# Patient Record
Sex: Male | Born: 1980 | Race: White | Hispanic: No | Marital: Married | Smoking: Current every day smoker
Health system: Southern US, Community
[De-identification: ages and names within clinical notes are randomized; demographics above are authoritative.]

## PROBLEM LIST (undated history)

## (undated) HISTORY — PX: HERNIA REPAIR: SHX51

---
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
## 2002-06-27 ENCOUNTER — Emergency Department (HOSPITAL_COMMUNITY): Admission: EM | Admit: 2002-06-27 | Discharge: 2002-06-27 | Payer: Self-pay | Admitting: *Deleted

## 2002-11-07 ENCOUNTER — Encounter: Payer: Self-pay | Admitting: *Deleted

## 2002-11-07 ENCOUNTER — Ambulatory Visit (HOSPITAL_COMMUNITY): Admission: RE | Admit: 2002-11-07 | Discharge: 2002-11-07 | Payer: Self-pay | Admitting: *Deleted

## 2003-06-03 ENCOUNTER — Emergency Department (HOSPITAL_COMMUNITY): Admission: EM | Admit: 2003-06-03 | Discharge: 2003-06-03 | Payer: Self-pay | Admitting: Emergency Medicine

## 2011-09-24 ENCOUNTER — Emergency Department: Payer: Self-pay | Admitting: Emergency Medicine

## 2013-07-23 ENCOUNTER — Emergency Department (HOSPITAL_COMMUNITY): Payer: Self-pay

## 2013-07-23 ENCOUNTER — Emergency Department (HOSPITAL_COMMUNITY)
Admission: EM | Admit: 2013-07-23 | Discharge: 2013-07-24 | Disposition: A | Discharge status: Home / Self Care | Payer: Self-pay | Attending: Emergency Medicine | Admitting: Emergency Medicine

## 2013-07-23 ENCOUNTER — Encounter (HOSPITAL_COMMUNITY): Payer: Self-pay | Admitting: Emergency Medicine

## 2013-07-23 DIAGNOSIS — S60011A Contusion of right thumb without damage to nail, initial encounter: Secondary | ICD-10-CM

## 2013-07-23 DIAGNOSIS — IMO0002 Reserved for concepts with insufficient information to code with codable children: Secondary | ICD-10-CM | POA: Insufficient documentation

## 2013-07-23 DIAGNOSIS — S46911A Strain of unspecified muscle, fascia and tendon at shoulder and upper arm level, right arm, initial encounter: Secondary | ICD-10-CM

## 2013-07-23 DIAGNOSIS — F17200 Nicotine dependence, unspecified, uncomplicated: Secondary | ICD-10-CM | POA: Insufficient documentation

## 2013-07-23 DIAGNOSIS — Y929 Unspecified place or not applicable: Secondary | ICD-10-CM | POA: Insufficient documentation

## 2013-07-23 DIAGNOSIS — Y9389 Activity, other specified: Secondary | ICD-10-CM | POA: Insufficient documentation

## 2013-07-23 DIAGNOSIS — S6000XA Contusion of unspecified finger without damage to nail, initial encounter: Secondary | ICD-10-CM | POA: Insufficient documentation

## 2013-07-23 MED ORDER — KETOROLAC TROMETHAMINE 10 MG PO TABS
10.0000 mg | ORAL_TABLET | Freq: Once | ORAL | Status: AC
  Administered 2013-07-23: 10 mg via ORAL
  Filled 2013-07-23: qty 1

## 2013-07-23 MED ORDER — TRAMADOL HCL 50 MG PO TABS
50.0000 mg | ORAL_TABLET | Freq: Once | ORAL | Status: AC
  Administered 2013-07-23: 50 mg via ORAL
  Filled 2013-07-23: qty 1

## 2013-07-23 MED ORDER — MELOXICAM 7.5 MG PO TABS: ORAL_TABLET | ORAL | Status: AC

## 2013-07-23 MED ORDER — DEXAMETHASONE 4 MG PO TABS: ORAL_TABLET | ORAL | Status: AC

## 2013-07-23 MED ORDER — ONDANSETRON HCL 4 MG PO TABS
4.0000 mg | ORAL_TABLET | Freq: Once | ORAL | Status: AC
  Administered 2013-07-23: 4 mg via ORAL
  Filled 2013-07-23: qty 1

## 2013-07-23 MED ORDER — PREDNISONE 50 MG PO TABS
60.0000 mg | ORAL_TABLET | Freq: Once | ORAL | Status: AC
  Administered 2013-07-23: 60 mg via ORAL
  Filled 2013-07-23 (×2): qty 1

## 2013-07-23 MED ORDER — TRAMADOL HCL 50 MG PO TABS: 50.0000 mg | ORAL_TABLET | Freq: Four times a day (QID) | ORAL | Status: AC | PRN

## 2013-07-23 NOTE — ED Notes (Signed)
Patient states was holding a part and hit his right thumb while hitting the part on Friday.  Patient c/o increased pain and swelling.  Patient also c/o right shoulder pain x 1 week.  Patient denies injury or trauma on right shoulder.

## 2013-07-23 NOTE — Discharge Instructions (Signed)
Examination is consistent with a strain of your right shoulder, a bruise to your ulnar nerve, and contusion to the right thumb. Your x-rays are negative for fracture or dislocation. Please use the thumb splint, and a shoulder sling for the next 5-7 days. Please use of Mobic and Decadron daily, please take with food. Use Tramadol for pain if needed. This medication may cause drowsiness, please use with caution. Please see the orthopedist listed above with the orthopedist of your choice if pain persists, or condition worsens. Contusion A contusion is a deep bruise. Contusions happen when an injury causes bleeding under the skin. Signs of bruising include pain, puffiness (swelling), and discolored skin. The contusion may turn blue, purple, or yellow. HOME CARE   Put ice on the injured area.  Put ice in a plastic bag.  Place a towel between your skin and the bag.  Leave the ice on for 15-20 minutes, 03-04 times a day.  Only take medicine as told by your doctor.  Rest the injured area.  If possible, raise (elevate) the injured area to lessen puffiness. GET HELP RIGHT AWAY IF:   You have more bruising or puffiness.  You have pain that is getting worse.  Your puffiness or pain is not helped by medicine. MAKE SURE YOU:   Understand these instructions.  Will watch your condition.  Will get help right away if you are not doing well or get worse. Document Released: 08/19/2007 Document Revised: 05/25/2011 Document Reviewed: 01/05/2011 [facility] Patient Information 2014 [place], [place].
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
## 2013-07-24 NOTE — ED Notes (Signed)
Thumb splint and arm sling applied by Joseph ArtAlfred Dillard, NT.

## 2013-07-25 NOTE — ED Provider Notes (Signed)
Medical screening examination/treatment/procedure(s) were performed by non-physician practitioner and as supervising physician I was immediately available for consultation/collaboration.   EKG Interpretation None        Debborah Alonge L Fard Borunda, MD 07/25/13 0049 

## 2013-08-10 ENCOUNTER — Emergency Department: Payer: Self-pay | Admitting: Internal Medicine

## 2013-08-10 LAB — CBC
HCT: 46.7 % (ref 40.0–52.0)
HGB: 16.1 g/dL (ref 13.0–18.0)
MCH: 29.4 pg (ref 26.0–34.0)
MCHC: 34.4 g/dL (ref 32.0–36.0)
MCV: 85 fL (ref 80–100)
PLATELETS: 259 10*3/uL (ref 150–440)
RBC: 5.48 10*6/uL (ref 4.40–5.90)
RDW: 13 % (ref 11.5–14.5)
WBC: 11.4 10*3/uL — AB (ref 3.8–10.6)

## 2013-08-10 LAB — URINALYSIS, COMPLETE
BACTERIA: NONE SEEN
Bilirubin,UR: NEGATIVE
Blood: NEGATIVE
GLUCOSE, UR: NEGATIVE mg/dL (ref 0–75)
KETONE: NEGATIVE
LEUKOCYTE ESTERASE: NEGATIVE
Nitrite: NEGATIVE
Ph: 8 (ref 4.5–8.0)
Protein: NEGATIVE
RBC,UR: <1 /HPF (ref 0–5)
SPECIFIC GRAVITY: 1.014 (ref 1.003–1.030)
Squamous Epithelial: NONE SEEN
WBC UR: NONE SEEN /HPF (ref 0–5)

## 2013-08-10 LAB — COMPREHENSIVE METABOLIC PANEL
ALBUMIN: 3.7 g/dL (ref 3.4–5.0)
ALT: 33 U/L (ref 12–78)
Alkaline Phosphatase: 97 U/L
Anion Gap: 2 — ABNORMAL LOW (ref 7–16)
BUN: 9 mg/dL (ref 7–18)
Bilirubin,Total: 0.3 mg/dL (ref 0.2–1.0)
CO2: 27 mmol/L (ref 21–32)
CREATININE: 0.91 mg/dL (ref 0.60–1.30)
Calcium, Total: 8.4 mg/dL — ABNORMAL LOW (ref 8.5–10.1)
Chloride: 109 mmol/L — ABNORMAL HIGH (ref 98–107)
EGFR (African American): >60
EGFR (Non-African Amer.): >60
Glucose: 95 mg/dL (ref 65–99)
Osmolality: 274 (ref 275–301)
POTASSIUM: 4.1 mmol/L (ref 3.5–5.1)
SGOT(AST): 33 U/L (ref 15–37)
Sodium: 138 mmol/L (ref 136–145)
TOTAL PROTEIN: 6.9 g/dL (ref 6.4–8.2)

## 2013-08-10 LAB — DRUG SCREEN, URINE

## 2013-08-10 LAB — CK TOTAL AND CKMB (NOT AT ARMC)
CK, Total: 90 U/L
CK-MB: 0.6 ng/mL (ref 0.5–3.6)

## 2013-08-10 LAB — TROPONIN I: Troponin-I: <0.02 ng/mL

## 2013-08-10 LAB — ETHANOL
Ethanol %: <0.003 % (ref 0.000–0.080)
Ethanol: <3 mg/dL

## 2014-07-07 NOTE — Consult Note (Signed)
PATIENT NAME:  Dominic Reed, CASEBEER MR#:  [id number] DATE OF BIRTH:  1981-02-24  DATE OF CONSULTATION:  08/10/2013  REFERRING PHYSICIAN:  Dr. Benjaman Lobe.  CONSULTING PHYSICIAN:  Gladstone Lighter, MD  PRIMARY CARE PHYSICIAN:  None.   CHIEF COMPLAINT:  Syncope.   HISTORY OF PRESENT ILLNESS:  [name] is a 34 year old young Caucasian male with past medical history significant for anxiety and the use of Adderall in the remote past that was brought in from home secondary to syncopal episode and a near syncopal episode. The patient states that he had a severe headache last night, woke up this morning, could not sleep well at all last night as he has an infant at home. When he woke up this morning, he complained of 10/10 headache in the vertex of the head, no prior history of any migraines, not associated with nausea or vomiting and then his mom heard a thud and came into the room and the patient said he almost blacked out due to the severe headache and sat on the floor. The patient denies losing any consciousness. So he took some Excedrin and also allergy medicines because he thought he was also having some sinusitis. He drove to work but had to pull up in the parking lot of the work as he felt transient black out for a few seconds due to the headache. He denies another syncopal episode. Denies any visual changes but he feels that his intense headache could have caused that. He is not taking Adderall or any other medications at this time. He denies using any drugs or alcohol. He states he had a similar episode more than a year ago. He does say that he is under a lot of stress lately, decreased sleep, and dealing with anxiety as well. All his labs came back normal. CT of the head is negative. No history of any brain aneurysms or no history of frequent headaches. The patient wanted to go home and have an outpatient followup. This sounded reasonable at this time and he was advised to follow up at [facility] or call one of the local physicians' office to set up a new PCP.   PAST MEDICAL HISTORY:  Anxiety issues.   PAST SURGICAL HISTORY:  None.   ALLERGIES:  NONE.   CURRENT HOME MEDICATIONS:  None.   SOCIAL HISTORY:  Lives at home with his girlfriend, and has 2 kids. Smokes 3 or 4 cigarettes per day, not every day, too. No drugs, no use of alcohol. Works in the [employer] in [place].   FAMILY HISTORY:  Parents healthy. No significant medical problems in the family.   REVIEW OF SYSTEMS:  CONSTITUTIONAL:  No fever, fatigue or weakness.  EYES:  No blurred vision, double vision, inflammation or glaucoma.  ENT:  No tinnitus, ear pain, hearing loss, epistaxis or discharge.  RESPIRATORY:  No cough, wheeze, hemoptysis or COPD.  CARDIOVASCULAR:  No chest pain, orthopnea, edema, arrhythmia, palpitations or syncope.  GASTROINTESTINAL:  No nausea, vomiting, diarrhea, abdominal pain, hematemesis or melena.  GENITOURINARY:  No dysuria, anterior, renal calculus, frequency or incontinence.  ENDOCRINE:  No polyuria, nocturia, thyroid problems, heat or cold intolerance.  HEMATOLOGY:  No anemia, easy bruising or bleeding.  SKIN:  No acne, rash or lesions.  MUSCULOSKELETAL:  No neck, back, shoulder pain, arthritis or gout.  NEUROLOGIC:  Headache present, no numbness, weakness, CVA, TIA or seizures.  PSYCHOLOGICAL:  No insomnia or depression.   PHYSICAL EXAMINATION:  VITAL SIGNS:  Temperature 97.2 degrees Fahrenheit, pulse 93, respirations 20, blood pressure 152/62, pulse ox 98% on room air.  GENERAL:  Well-built, well-nourished male lying in bed, not in any acute distress.  HEENT:  Normocephalic, atraumatic. Pupils equal, round, reacting to light. Anicteric sclerae. Extraocular movements intact.  OROPHARYNX:  Clear without erythema, mass or exudates.  NECK:  Supple. No thyromegaly, JVD or carotid bruits. No lymphadenopathy.  LUNGS:  Moving air bilaterally. No wheeze or crackles. No use of  accessory muscles for breathing.  CARDIOVASCULAR:  S1, S2, regular rate and rhythm. No murmurs, rubs or gallops.  ABDOMEN:  Soft, nontender, nondistended. No hepatosplenomegaly. Normal bowel sounds.  EXTREMITIES:  No pedal edema. No clubbing or cyanosis, 2+ dorsalis pedis pulses palpable bilaterally.  SKIN:  No acne, rash or lesions.  LYMPHATICS:  No cervical or inguinal lymphadenopathy.  NEUROLOGICAL:  Cranial nerves intact. No focal motor or sensory deficits.  PSYCHOLOGICAL:  Awake, alert, oriented x 3.  LABORATORY, DIAGNOSTIC AND RADIOLOGICAL DATA:  Urinalysis negative for any infection. Urine tox screen negative. Alcohol level is negative. WBC is 11.4, hemoglobin 16.1, hematocrit 46.7, platelet count 259.   Sodium 138, potassium 4.1, chloride 109, bicarb 27, BUN 9, creatinine 0.9, glucose 95 and calcium of 8.4.   ALT 33, AST 33, alk phos 97, total bilirubin 0.3 and albumin of 3.79. Troponin is negative. CK 90, CK-MB 0.6. Chest x-ray showing no acute cardiopulmonary disease. CT of the head showing no acute abnormality noted. Clear CT head.  RECOMMENDATION:  This is a 34 year old male with no significant past medical history who has been having got a lot of stress lately. He came into the ER for a near syncopal episode following a severe headache.  1.  Syncope. Likely vasovagal following a severe headache. He has been under a lot of stress, less sleep. He never lost consciousness according to the patient and is feeling a lot better with the headache improving at this time. So hydration advised, rest for today, work note given and is being discharged from the ER and strongly recommended to follow up with the primary care physician and set up one.   DISCHARGE CONDITION:  Stable.   DISCHARGE DISPOSITION:  Home.   TOTAL TIME SPENT:  55 minutes.   ____________________________ Gladstone Lighter, MD [id number] D: 08/10/2013 15:28:04 ET T: 08/10/2013 16:12:34 ET JOB#: [id number]  cc: Gladstone Lighter, MD, <Dictator> Gladstone Lighter MD ELECTRONICALLY SIGNED 08/11/2013 12:35
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
